# Patient Record
Sex: Female | Born: 1935 | Race: White | Hispanic: No | Marital: Single | Smoking: Never smoker
Health system: Southern US, Community
[De-identification: ages and names within clinical notes are randomized; demographics above are authoritative.]

## PROBLEM LIST (undated history)

## (undated) DIAGNOSIS — I10 Essential (primary) hypertension: Secondary | ICD-10-CM

## (undated) DIAGNOSIS — F0390 Unspecified dementia without behavioral disturbance: Secondary | ICD-10-CM

## (undated) DIAGNOSIS — M1990 Unspecified osteoarthritis, unspecified site: Secondary | ICD-10-CM

## (undated) DIAGNOSIS — E079 Disorder of thyroid, unspecified: Secondary | ICD-10-CM

## (undated) DIAGNOSIS — M419 Scoliosis, unspecified: Secondary | ICD-10-CM

## (undated) HISTORY — PX: ABDOMINAL HYSTERECTOMY: SHX81

## (undated) HISTORY — PX: TONSILLECTOMY: SUR1361

---

## 2000-02-13 ENCOUNTER — Emergency Department (HOSPITAL_COMMUNITY): Admission: EM | Admit: 2000-02-13 | Discharge: 2000-02-13 | Payer: Self-pay | Admitting: Emergency Medicine

## 2000-02-13 ENCOUNTER — Encounter: Payer: Self-pay | Admitting: Emergency Medicine

## 2012-12-29 ENCOUNTER — Emergency Department (HOSPITAL_BASED_OUTPATIENT_CLINIC_OR_DEPARTMENT_OTHER)
Admission: EM | Admit: 2012-12-29 | Discharge: 2012-12-29 | Disposition: A | Discharge status: Home / Self Care | Payer: Medicare Other | Attending: Emergency Medicine | Admitting: Emergency Medicine

## 2012-12-29 ENCOUNTER — Encounter (HOSPITAL_BASED_OUTPATIENT_CLINIC_OR_DEPARTMENT_OTHER): Payer: Self-pay | Admitting: *Deleted

## 2012-12-29 ENCOUNTER — Emergency Department (HOSPITAL_BASED_OUTPATIENT_CLINIC_OR_DEPARTMENT_OTHER): Payer: Medicare Other

## 2012-12-29 DIAGNOSIS — S2239XA Fracture of one rib, unspecified side, initial encounter for closed fracture: Secondary | ICD-10-CM | POA: Insufficient documentation

## 2012-12-29 DIAGNOSIS — E079 Disorder of thyroid, unspecified: Secondary | ICD-10-CM | POA: Insufficient documentation

## 2012-12-29 DIAGNOSIS — Y929 Unspecified place or not applicable: Secondary | ICD-10-CM | POA: Insufficient documentation

## 2012-12-29 DIAGNOSIS — Z79899 Other long term (current) drug therapy: Secondary | ICD-10-CM | POA: Insufficient documentation

## 2012-12-29 DIAGNOSIS — W07XXXA Fall from chair, initial encounter: Secondary | ICD-10-CM | POA: Insufficient documentation

## 2012-12-29 DIAGNOSIS — Y9389 Activity, other specified: Secondary | ICD-10-CM | POA: Insufficient documentation

## 2012-12-29 DIAGNOSIS — I10 Essential (primary) hypertension: Secondary | ICD-10-CM | POA: Insufficient documentation

## 2012-12-29 HISTORY — DX: Essential (primary) hypertension: I10

## 2012-12-29 HISTORY — DX: Disorder of thyroid, unspecified: E07.9

## 2012-12-29 MED ORDER — IBUPROFEN 400 MG PO TABS
400.0000 mg | ORAL_TABLET | Freq: Once | ORAL | Status: AC
  Administered 2012-12-29: 400 mg via ORAL
  Filled 2012-12-29: qty 1

## 2012-12-29 MED ORDER — OXYCODONE-ACETAMINOPHEN 5-325 MG PO TABS
1.0000 | ORAL_TABLET | Freq: Once | ORAL | Status: AC
  Administered 2012-12-29: 1 via ORAL
  Filled 2012-12-29 (×2): qty 1

## 2012-12-29 MED ORDER — OXYCODONE-ACETAMINOPHEN 5-325 MG PO TABS: 1.0000 | ORAL_TABLET | Freq: Three times a day (TID) | ORAL | Status: AC | PRN

## 2012-12-29 NOTE — ED Notes (Signed)
Sitting in a chair and fell hitting the arm of a chair.  C.o pain in her right ribs.

## 2012-12-29 NOTE — ED Notes (Signed)
MD at bedside. 

## 2012-12-29 NOTE — ED Notes (Signed)
Patient resident at [facility]. Pt accompanied by Daughter, in waiting room.

## 2012-12-29 NOTE — ED Notes (Signed)
Patient back from  X-ray 

## 2012-12-29 NOTE — ED Provider Notes (Signed)
History     CSN: [id number]  Arrival date & time 12/29/12  1925   First MD Initiated Contact with Patient 12/29/12 1931      Chief Complaint  Patient presents with  . Fall    (Consider location/radiation/quality/duration/timing/severity/associated sxs/prior treatment) HPI Comments: She denies any weakness or numbness.  No focal deficits causing the fall.  No syncope or LOC.  Patient is a 79 y.o. female presenting with fall. The history is provided by the patient.  Fall The accident occurred 6 to 12 hours ago. Fall occurred: she was sitting down in a chair and lost her balance sitting down abruptly and thinks she must have hit her ribs on arm of the chair. She fell from a height of 1 to 2 ft. Impact surface: firm surface. There was no blood loss. Point of impact: right ribs. Pain location: right ribs. The pain is at a severity of 7/10. The pain is moderate. She was ambulatory at the scene. Pertinent negatives include no numbness, no abdominal pain, no nausea, no vomiting, no headaches and no loss of consciousness. The symptoms are aggravated by activity and pressure on the injury. She has tried nothing for the symptoms. The treatment provided no relief.    Past Medical History  Diagnosis Date  . Hypertension   . Thyroid disease     Past Surgical History  Procedure Laterality Date  . Tonsillectomy      No family history on file.  History  Substance Use Topics  . Smoking status: Never Smoker   . Smokeless tobacco: Not on file  . Alcohol Use: No    OB History   Grav Para Term Preterm Abortions TAB SAB Ect Mult Living                  Review of Systems  Respiratory: Negative for shortness of breath.   Cardiovascular: Positive for chest pain. Negative for leg swelling.  Gastrointestinal: Negative for nausea, vomiting and abdominal pain.  Neurological: Negative for dizziness, seizures, loss of consciousness, syncope, speech difficulty, weakness, light-headedness, numbness  and headaches.  All other systems reviewed and are negative.    Allergies  Review of patient's allergies indicates no known allergies.  Home Medications   Current Outpatient Rx  Name  Route  Sig  Dispense  Refill  . Alendronate Sodium (FOSAMAX PO)   Oral   Take by mouth.         . Donepezil HCl (ARICEPT PO)   Oral   Take by mouth.         . ENALAPRIL MALEATE PO   Oral   Take by mouth.         . Levothyroxine Sodium (SYNTHROID PO)   Oral   Take by mouth.         Marland Kitchen LOVASTATIN PO   Oral   Take by mouth.         . Memantine HCl (NAMENDA PO)   Oral   Take by mouth.           BP 165/63  Pulse 62  Temp(Src) 98.6 F (37 C) (Oral)  Resp 18  SpO2 95%  Physical Exam  Nursing note and vitals reviewed. Constitutional: She is oriented to person, place, and time. She appears well-developed and well-nourished. She appears distressed.  HENT:  Head: Normocephalic and atraumatic.  Eyes: EOM are normal. Pupils are equal, round, and reactive to light.  Cardiovascular: Normal rate, regular rhythm, normal heart sounds and intact distal pulses.  Exam reveals no friction rub.   No murmur heard. Pulmonary/Chest: Effort normal and breath sounds normal. She has no wheezes. She has no rales. She exhibits tenderness and bony tenderness. She exhibits no laceration, no crepitus, no deformity and no retraction.    Abdominal: Soft. Normal appearance and bowel sounds are normal. She exhibits no distension. There is no tenderness. There is no rebound and no guarding.  No RUQ tenderness  Musculoskeletal: Normal range of motion. She exhibits no tenderness.  No edema  Neurological: She is alert and oriented to person, place, and time. No cranial nerve deficit.  Skin: Skin is warm and dry. No rash noted.  Psychiatric: She has a normal mood and affect. Her behavior is normal.    ED Course  Procedures (including critical care time)  Labs Reviewed - No data to display Dg Ribs  Unilateral W/chest Right  12/29/2012  *RADIOLOGY REPORT*  Clinical Data: Fall  RIGHT RIBS AND CHEST - 3+ VIEW  Comparison: None.  Findings: Normal heart size.  Lungs are hyperaerated with interstitial prominence.  Mild apical pleural thickening has a chronic appearance.  No pneumothorax.  No pleural effusion. Pulmonary vascularity is within normal limits.  Levoscoliosis with the apex at L2-3 is present.  Degenerative changes in the lumbar spine are associated.  There is a minimally displaced fracture of the anterolateral right eighth rib.  Remainder of the bony framework is grossly intact.  Proximal right humerus deformity is noted and acute proximal right humerus fracture is suspected.  IMPRESSION: Minimally displaced acute right eighth rib fracture.  Proximal right humerus fracture suspected.  Right shoulder study is recommended.  Changes related to COPD.  No pneumothorax.   Original Report Authenticated By: Jolaine Click, M.D.    Dg Humerus Right  12/29/2012  *RADIOLOGY REPORT*  Clinical Data: 79 year old female status post fall.  Right humerus pain.  History of right humerus fracture.  RIGHT HUMERUS - 2+ VIEW  Comparison: [facility] chest radiograph 01/01/2011.  Findings: The proximal right humerus has been fractured, but there is callus formation suggesting this is a late subacute or chronic injury.  There is subsequent impaction.  These changes are new since 2012.  Grossly normal alignment elsewhere the right shoulder. More distal right humerus appears intact with grossly normal alignment at the right elbow.  IMPRESSION: Late subacute to chronic-appearing proximal right humerus fracture which is new since 2012.  No definite acute right humerus fracture.   Original Report Authenticated By: Erskine Speed, M.D.      1. Rib fracture       MDM   Patient states she was sitting down in a chair today and lost her balance and sat down abruptly. She thinks she must have hit her right ribs on the arm  of the chair. Since about 2:00 she's had severe pain in her right ribs that is not improving. She denies shortness of breath and denies any abdominal pain. No LOC and denies any focal weakness. She has point tenderness to her right rib cage as well as mild ecchymosis. She has taken nothing for the pain and was given ibuprofen. Rib films and chest x-ray pending   11:35 PM Chest x-ray shows a mildly displaced acute right eighth rib fracture. Also concern for possible humeral fracture on chest x-ray. On exam patient does have some tenderness but states that she had a prior injury care over a year ago. Dedicated humeral films show a chronic appearing right humeral fracture and given patient's story  and the way she injured her ribs do not feel this is a new injury. She was given pain control and close followup instructions     Gwyneth Sprout, MD 12/29/12 2336

## 2015-02-03 ENCOUNTER — Emergency Department (HOSPITAL_BASED_OUTPATIENT_CLINIC_OR_DEPARTMENT_OTHER)
Admission: EM | Admit: 2015-02-03 | Discharge: 2015-02-04 | Disposition: A | Discharge status: Other Acute Inpatient Hospital | Payer: Medicare Other | Attending: Emergency Medicine | Admitting: Emergency Medicine

## 2015-02-03 ENCOUNTER — Emergency Department (HOSPITAL_BASED_OUTPATIENT_CLINIC_OR_DEPARTMENT_OTHER): Payer: Medicare Other

## 2015-02-03 ENCOUNTER — Encounter (HOSPITAL_BASED_OUTPATIENT_CLINIC_OR_DEPARTMENT_OTHER): Payer: Self-pay | Admitting: *Deleted

## 2015-02-03 DIAGNOSIS — M1990 Unspecified osteoarthritis, unspecified site: Secondary | ICD-10-CM | POA: Diagnosis not present

## 2015-02-03 DIAGNOSIS — F0390 Unspecified dementia without behavioral disturbance: Secondary | ICD-10-CM | POA: Insufficient documentation

## 2015-02-03 DIAGNOSIS — R4182 Altered mental status, unspecified: Secondary | ICD-10-CM | POA: Diagnosis present

## 2015-02-03 DIAGNOSIS — N390 Urinary tract infection, site not specified: Secondary | ICD-10-CM | POA: Diagnosis not present

## 2015-02-03 DIAGNOSIS — E079 Disorder of thyroid, unspecified: Secondary | ICD-10-CM | POA: Diagnosis not present

## 2015-02-03 DIAGNOSIS — Z791 Long term (current) use of non-steroidal anti-inflammatories (NSAID): Secondary | ICD-10-CM | POA: Diagnosis not present

## 2015-02-03 DIAGNOSIS — I10 Essential (primary) hypertension: Secondary | ICD-10-CM | POA: Insufficient documentation

## 2015-02-03 DIAGNOSIS — R000 Tachycardia, unspecified: Secondary | ICD-10-CM | POA: Insufficient documentation

## 2015-02-03 DIAGNOSIS — Z79899 Other long term (current) drug therapy: Secondary | ICD-10-CM | POA: Diagnosis not present

## 2015-02-03 DIAGNOSIS — Z90710 Acquired absence of both cervix and uterus: Secondary | ICD-10-CM | POA: Diagnosis not present

## 2015-02-03 HISTORY — DX: Unspecified osteoarthritis, unspecified site: M19.90

## 2015-02-03 HISTORY — DX: Scoliosis, unspecified: M41.9

## 2015-02-03 HISTORY — DX: Unspecified dementia, unspecified severity, without behavioral disturbance, psychotic disturbance, mood disturbance, and anxiety: F03.90

## 2015-02-03 LAB — URINALYSIS, ROUTINE W REFLEX MICROSCOPIC
BILIRUBIN URINE: NEGATIVE
Glucose, UA: NEGATIVE mg/dL
Ketones, ur: 15 mg/dL — AB
Nitrite: NEGATIVE
PROTEIN: 100 mg/dL — AB
SPECIFIC GRAVITY, URINE: 1.012 (ref 1.005–1.030)
UROBILINOGEN UA: 1 mg/dL (ref 0.0–1.0)
pH: 7.5 (ref 5.0–8.0)

## 2015-02-03 LAB — URINE MICROSCOPIC-ADD ON

## 2015-02-03 LAB — COMPREHENSIVE METABOLIC PANEL
ALT: 27 U/L (ref 0–35)
ANION GAP: 10 (ref 5–15)
AST: 31 U/L (ref 0–37)
Albumin: 3.6 g/dL (ref 3.5–5.2)
Alkaline Phosphatase: 58 U/L (ref 39–117)
BILIRUBIN TOTAL: 0.8 mg/dL (ref 0.3–1.2)
BUN: 19 mg/dL (ref 6–23)
CHLORIDE: 104 mmol/L (ref 96–112)
CO2: 23 mmol/L (ref 19–32)
Calcium: 8.5 mg/dL (ref 8.4–10.5)
Creatinine, Ser: 0.91 mg/dL (ref 0.50–1.10)
GFR, EST AFRICAN AMERICAN: 62 mL/min — AB (ref >90)
GFR, EST NON AFRICAN AMERICAN: 53 mL/min — AB (ref >90)
GLUCOSE: 125 mg/dL — AB (ref 70–99)
POTASSIUM: 3.2 mmol/L — AB (ref 3.5–5.1)
SODIUM: 137 mmol/L (ref 135–145)
Total Protein: 6.7 g/dL (ref 6.0–8.3)

## 2015-02-03 LAB — CBC WITH DIFFERENTIAL/PLATELET
Basophils Absolute: 0 10*3/uL (ref 0.0–0.1)
Basophils Relative: 0 % (ref 0–1)
Eosinophils Absolute: 0 10*3/uL (ref 0.0–0.7)
Eosinophils Relative: 0 % (ref 0–5)
HEMATOCRIT: 41 % (ref 36.0–46.0)
HEMOGLOBIN: 13.4 g/dL (ref 12.0–15.0)
LYMPHS ABS: 0.4 10*3/uL — AB (ref 0.7–4.0)
Lymphocytes Relative: 3 % — ABNORMAL LOW (ref 12–46)
MCH: 32.7 pg (ref 26.0–34.0)
MCHC: 32.7 g/dL (ref 30.0–36.0)
MCV: 100 fL (ref 78.0–100.0)
MONOS PCT: 5 % (ref 3–12)
Monocytes Absolute: 0.9 10*3/uL (ref 0.1–1.0)
NEUTROS ABS: 14.8 10*3/uL — AB (ref 1.7–7.7)
NEUTROS PCT: 92 % — AB (ref 43–77)
PLATELETS: 182 10*3/uL (ref 150–400)
RBC: 4.1 MIL/uL (ref 3.87–5.11)
RDW: 14.2 % (ref 11.5–15.5)
WBC: 16.1 10*3/uL — ABNORMAL HIGH (ref 4.0–10.5)

## 2015-02-03 LAB — I-STAT CG4 LACTIC ACID, ED: Lactic Acid, Venous: 0.9 mmol/L (ref 0.5–2.0)

## 2015-02-03 MED ORDER — DEXTROSE 5 % IV SOLN: 2.0000 g | Freq: Once | INTRAVENOUS | Status: DC

## 2015-02-03 MED ORDER — DEXTROSE 5 % IV SOLN
2.0000 g | INTRAVENOUS | Status: DC
  Administered 2015-02-04: 2 g via INTRAVENOUS

## 2015-02-03 MED ORDER — ACETAMINOPHEN 325 MG PO TABS
650.0000 mg | ORAL_TABLET | Freq: Once | ORAL | Status: AC
  Administered 2015-02-03: 650 mg via ORAL
  Filled 2015-02-03: qty 2

## 2015-02-03 MED ORDER — SODIUM CHLORIDE 0.9 % IV BOLUS (SEPSIS)
1000.0000 mL | Freq: Once | INTRAVENOUS | Status: AC
  Administered 2015-02-03: 1000 mL via INTRAVENOUS

## 2015-02-03 MED ORDER — SODIUM CHLORIDE 0.9 % IV SOLN
1000.0000 mL | INTRAVENOUS | Status: DC
  Administered 2015-02-04: 1000 mL via INTRAVENOUS

## 2015-02-03 NOTE — ED Notes (Signed)
Daughter reports she last saw pt on Wednesday when she took the pt to her doctor and the pt received shots in her knees. She sts that today she arrived at the pt's home to find her weak, not answering questions and with tremors.

## 2015-02-03 NOTE — ED Provider Notes (Signed)
CSN: [id number]     Arrival date & time 02/03/15  1748 History  This chart was scribed for Alexis Loveless, MD by Evon Slack, ED Scribe. This patient was seen in room MH09/MH09 and the patient's care was started at 6:22 PM.      Chief Complaint  Patient presents with  . Altered Mental Status   The history is provided by a relative. No language interpreter was used.   HPI Comments: Alexis Green is a 81 y.o. female who presents to the Emergency Department complaining of AMS onset today. Daughter states that the last time she saw her was 2 days ago. Daughter states she presents with weakness, tremors, and confusion. Daughter characterizes her confusion as "she knows what she want to say but cant get anything out." Mother states that she has also urinated on her self while laying on the floor. Daughter states that 2 days ago she received injections in her bilateral knees for pain control. Daughter states that she also received epidural injection 9 days ago for her Hx of back pain. Daughter denies vomiting, cough or abdominal pain.   Past Medical History  Diagnosis Date  . Hypertension   . Thyroid disease   . Dementia   . Arthritis   . Scoliosis    Past Surgical History  Procedure Laterality Date  . Tonsillectomy    . Abdominal hysterectomy     No family history on file. History  Substance Use Topics  . Smoking status: Never Smoker   . Smokeless tobacco: Not on file  . Alcohol Use: No   OB History    No data available      Review of Systems  Constitutional: Positive for fever.  Respiratory: Negative for cough.   Gastrointestinal: Negative for abdominal pain.  Neurological: Positive for tremors and weakness.  All other systems reviewed and are negative.   Allergies  Sulfa antibiotics  Home Medications   Prior to Admission medications   Medication Sig Start Date End Date Taking? Authorizing Provider  amLODipine (NORVASC) 5 MG tablet Take 5 mg by mouth daily.   Yes  Historical Provider, MD  Donepezil HCl (ARICEPT PO) Take by mouth.   Yes Historical Provider, MD  ENALAPRIL MALEATE PO Take by mouth.   Yes Historical Provider, MD  Levothyroxine Sodium (SYNTHROID PO) Take by mouth.   Yes Historical Provider, MD  meloxicam (MOBIC) 7.5 MG tablet Take 5 mg by mouth daily.   Yes Historical Provider, MD  Memantine HCl (NAMENDA PO) Take by mouth.   Yes Historical Provider, MD  Alendronate Sodium (FOSAMAX PO) Take by mouth.    Historical Provider, MD  LOVASTATIN PO Take by mouth.    Historical Provider, MD  oxyCODONE-acetaminophen (PERCOCET/ROXICET) 5-325 MG per tablet Take 1 tablet by mouth every 8 (eight) hours as needed for pain (or take before bed). 12/29/12   Gwyneth Sprout, MD   BP 171/79 mmHg  Pulse 106  Temp(Src) 101.3 F (38.5 C)  Resp 18  Wt 122 lb (55.339 kg)  SpO2 99%   Physical Exam  Constitutional: She appears well-developed and well-nourished. No distress.  HENT:  Head: Normocephalic and atraumatic.  Eyes: Conjunctivae and EOM are normal.  Neck: Neck supple. No tracheal deviation present.  Cardiovascular: Regular rhythm and normal heart sounds.  Tachycardia present.   Pulmonary/Chest: Effort normal and breath sounds normal. No respiratory distress. She has no wheezes. She has no rales.  Abdominal: Soft. She exhibits no distension. There is no tenderness.  Musculoskeletal:  Normal range of motion.  No back tenderness or cellulitis. Bilateral knees without swelling or erythema.   Neurological: She is alert.  Alert but disoriented.    Skin: Skin is warm and dry. No erythema.  Nursing note and vitals reviewed.   ED Course  Procedures (including critical care time) DIAGNOSTIC STUDIES: Oxygen Saturation is 99% on RA, normal by my interpretation.    COORDINATION OF CARE: 6:49 PM-Discussed treatment plan with family at bedside and family agreed to plan.      Labs Review Labs Reviewed  CBC WITH DIFFERENTIAL/PLATELET - Abnormal; Notable  for the following:    WBC 16.1 (*)    Neutrophils Relative % 92 (*)    Neutro Abs 14.8 (*)    Lymphocytes Relative 3 (*)    Lymphs Abs 0.4 (*)    All other components within normal limits  COMPREHENSIVE METABOLIC PANEL - Abnormal; Notable for the following:    Potassium 3.2 (*)    Glucose, Bld 125 (*)    GFR calc non Af Amer 53 (*)    GFR calc Af Amer 62 (*)    All other components within normal limits  URINALYSIS, ROUTINE W REFLEX MICROSCOPIC - Abnormal; Notable for the following:    APPearance CLOUDY (*)    Hgb urine dipstick MODERATE (*)    Ketones, ur 15 (*)    Protein, ur 100 (*)    Leukocytes, UA MODERATE (*)    All other components within normal limits  URINE MICROSCOPIC-ADD ON - Abnormal; Notable for the following:    Squamous Epithelial / LPF FEW (*)    Bacteria, UA MANY (*)    All other components within normal limits  CULTURE, BLOOD (ROUTINE X 2)  CULTURE, BLOOD (ROUTINE X 2)  URINE CULTURE  I-STAT CG4 LACTIC ACID, ED  I-STAT CG4 LACTIC ACID, ED    Imaging Review Ct Head Wo Contrast  02/03/2015   CLINICAL DATA:  Altered mental status  EXAM: CT HEAD WITHOUT CONTRAST  TECHNIQUE: Contiguous axial images were obtained from the base of the skull through the vertex without intravenous contrast.  COMPARISON:  02/25/2012  FINDINGS: No skull fracture is noted. Paranasal sinuses and mastoid air cells are unremarkable. Atherosclerotic calcifications of carotid siphon are noted. Moderate cerebral atrophy. Extensive periventricular and patchy subcortical white matter decreased attenuation consistent with chronic small vessel ischemic changes. No definite acute cortical infarction. No mass lesion is noted on this unenhanced scan.  IMPRESSION: No acute intracranial abnormality. Stable atrophy and extensive chronic white matter disease.   Electronically Signed   By: Natasha MeadLiviu  Pop M.D.   On: 02/03/2015 19:38   Dg Chest Port 1 View  02/03/2015   CLINICAL DATA:  Altered mental status  EXAM:  PORTABLE CHEST - 1 VIEW  COMPARISON:  December 29, 2012  FINDINGS: There is no edema or consolidation. Heart size and pulmonary vascularity are normal. No adenopathy. There is thoracolumbar levoscoliosis.  IMPRESSION: No edema or consolidation.   Electronically Signed   By: Bretta BangWilliam  Woodruff III M.D.   On: 02/03/2015 19:15     EKG Interpretation   Date/Time:  Friday February 03 2015 19:41:19 EDT Ventricular Rate:  87 PR Interval:    QRS Duration: 80 QT Interval:  356 QTC Calculation: 428 R Axis:   47 Text Interpretation:  Atrial fibrillation with a competing junctional  pacemaker with premature ventricular or aberrantly conducted complexes  Septal infarct , age undetermined ST \\T \ T wave abnormality, consider  inferolateral ischemia Abnormal ECG  artifact limits interpretation No old  tracing to compare Confirmed by Evyn Kooyman  MD, Ricquel Foulk [phone]) on 02/03/2015  7:56:57 PM      MDM   Final diagnoses:  UTI (lower urinary tract infection)   Patient's fever appears to be coming from a UTI. Given Tylenol, fluids, and blood cultures obtained. Given IV Rocephin after urinalysis evaluated. This shouldn't explain her altered mental status as well. She is awake and alert and does not appear ill, has no neck stiffness, and I have very loose patient for a CNS infection. Given her elevated white blood cell count, fever, and age with UTI with sepsis, she will need admission with IV antibiotics and fluids. Discussed with the hospitalist at [facility], Dr. Lowell Guitar, who accepts admission and transfer.  I personally performed the services described in this documentation, which was scribed in my presence. The recorded information has been reviewed and is accurate.      Alexis Loveless, MD 02/03/15 2325

## 2015-02-03 NOTE — ED Notes (Signed)
Steward DroneBrenda Suman  [phone]

## 2015-02-04 DIAGNOSIS — I10 Essential (primary) hypertension: Secondary | ICD-10-CM | POA: Diagnosis not present

## 2015-02-04 DIAGNOSIS — R4182 Altered mental status, unspecified: Secondary | ICD-10-CM | POA: Diagnosis present

## 2015-02-04 DIAGNOSIS — M1990 Unspecified osteoarthritis, unspecified site: Secondary | ICD-10-CM | POA: Diagnosis not present

## 2015-02-04 DIAGNOSIS — F0390 Unspecified dementia without behavioral disturbance: Secondary | ICD-10-CM | POA: Diagnosis not present

## 2015-02-04 DIAGNOSIS — N390 Urinary tract infection, site not specified: Secondary | ICD-10-CM | POA: Diagnosis not present

## 2015-02-04 DIAGNOSIS — Z79899 Other long term (current) drug therapy: Secondary | ICD-10-CM | POA: Diagnosis not present

## 2015-02-04 DIAGNOSIS — E079 Disorder of thyroid, unspecified: Secondary | ICD-10-CM | POA: Diagnosis not present

## 2015-02-04 DIAGNOSIS — R000 Tachycardia, unspecified: Secondary | ICD-10-CM | POA: Diagnosis not present

## 2015-02-04 DIAGNOSIS — Z791 Long term (current) use of non-steroidal anti-inflammatories (NSAID): Secondary | ICD-10-CM | POA: Diagnosis not present

## 2015-02-04 DIAGNOSIS — Z90710 Acquired absence of both cervix and uterus: Secondary | ICD-10-CM | POA: Diagnosis not present

## 2015-02-04 MED ORDER — CEFTRIAXONE SODIUM 2 G IJ SOLR
INTRAMUSCULAR | Status: AC
  Filled 2015-02-04: qty 2

## 2015-02-04 NOTE — ED Notes (Signed)
[facility] at bedside 

## 2015-02-06 ENCOUNTER — Telehealth (HOSPITAL_COMMUNITY): Payer: Self-pay

## 2015-02-06 LAB — CULTURE, BLOOD (ROUTINE X 2)

## 2015-02-06 LAB — URINE CULTURE

## 2016-02-13 IMAGING — CT CT HEAD W/O CM
1 series · 16 of 30 positions shown, 20 images · non-contrast
Comparison: 02/25/2012

CLINICAL DATA: Altered mental status

EXAM:
CT HEAD WITHOUT CONTRAST
TECHNIQUE: Contiguous axial images were obtained from the base of the skull
through the vertex without intravenous contrast.

[Series 2: head 4.8 h37s · axial · 0.47mm/px · z∈[-151,-18]mm · 16 of 32 slices shown, 20 images]
[im 2/32  brain]
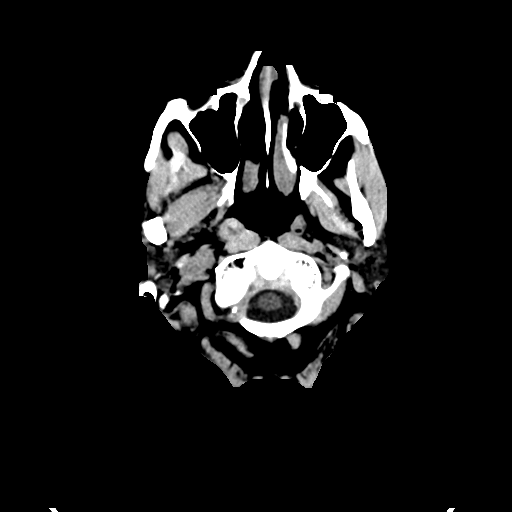
[im 2/32  bone]
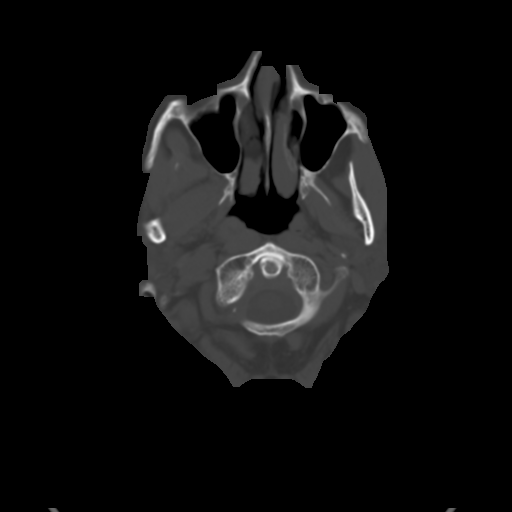
[im 4/32  brain]
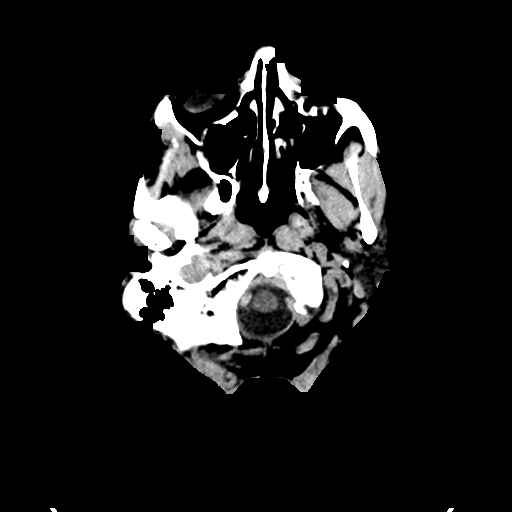
[im 6/32  brain]
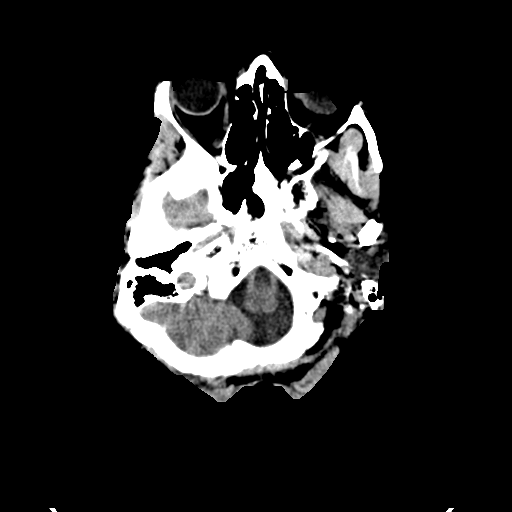
[im 8/32  brain]
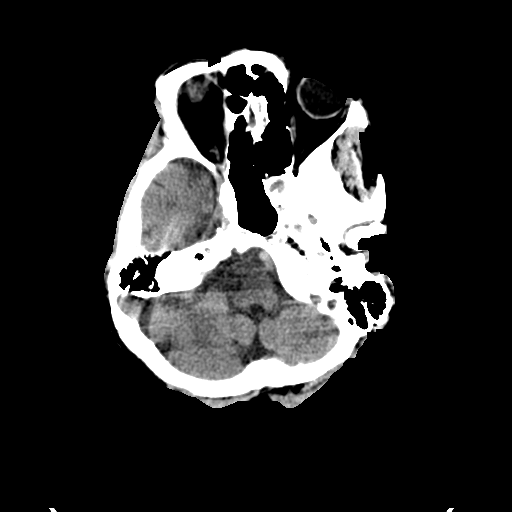
[im 9/32  brain]
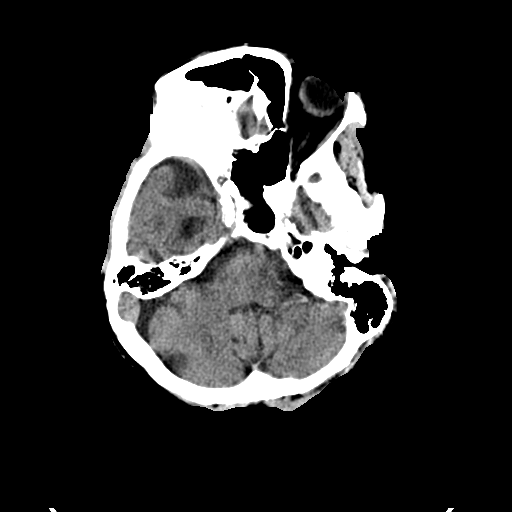
[im 9/32  bone]
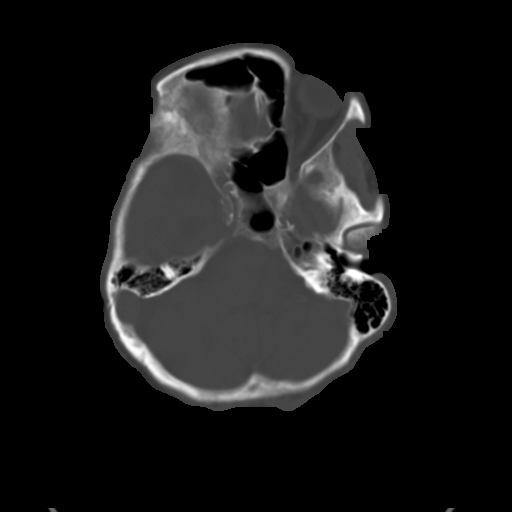
[im 11/32  brain]
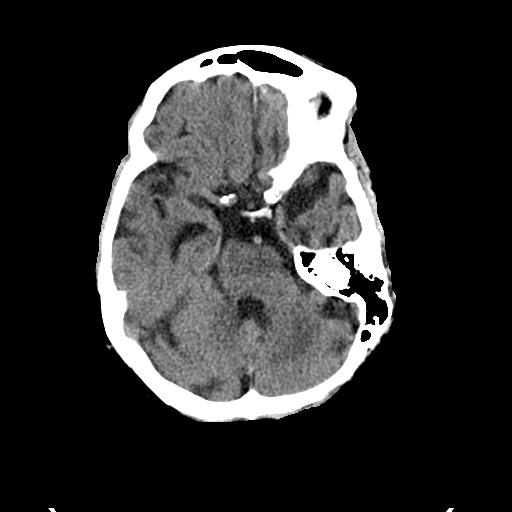
[im 13/32  brain]
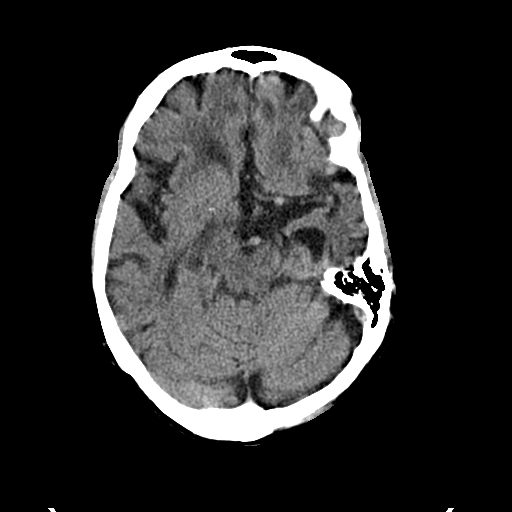
[im 15/32  brain]
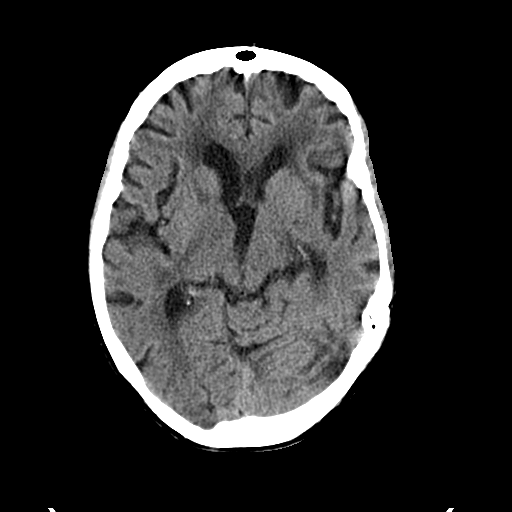
[im 17/32  brain]
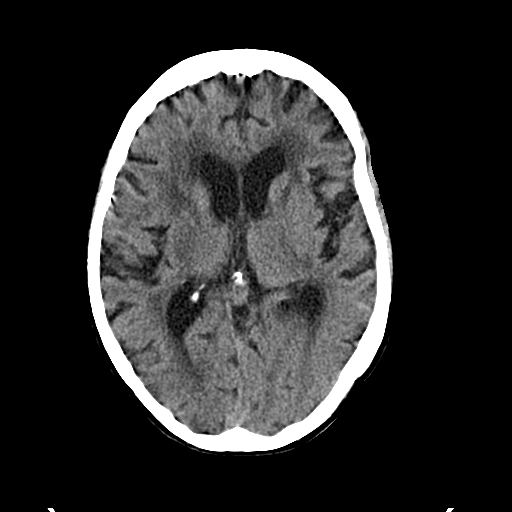
[im 17/32  bone]
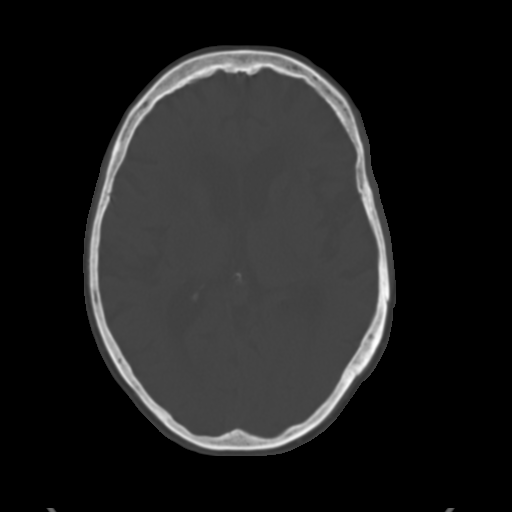
[im 19/32  brain]
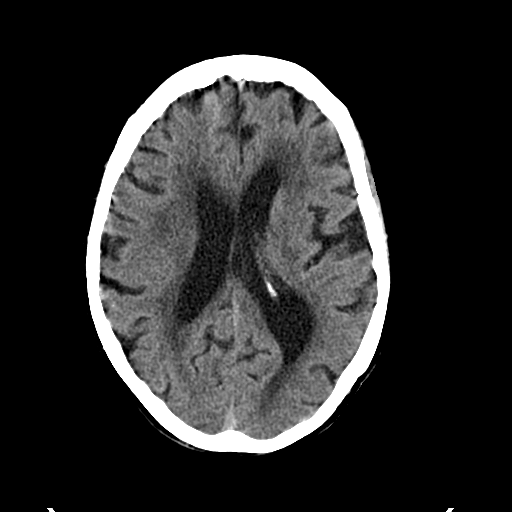
[im 21/32  brain]
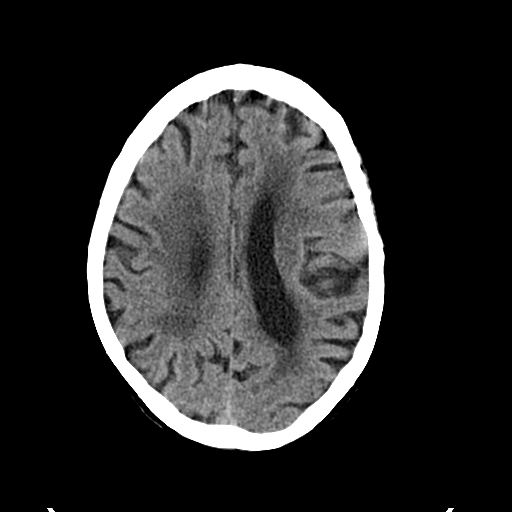
[im 23/32  brain]
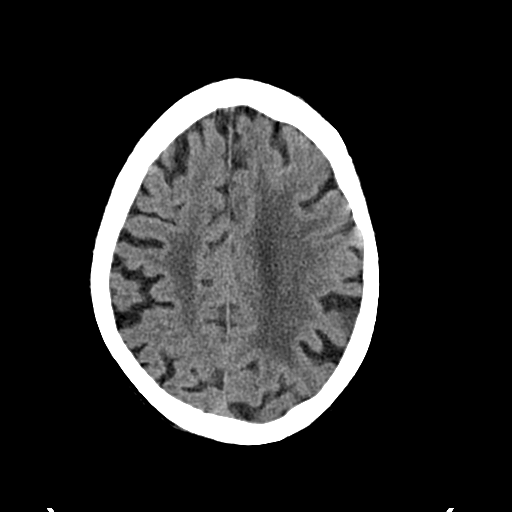
[im 24/32  brain]
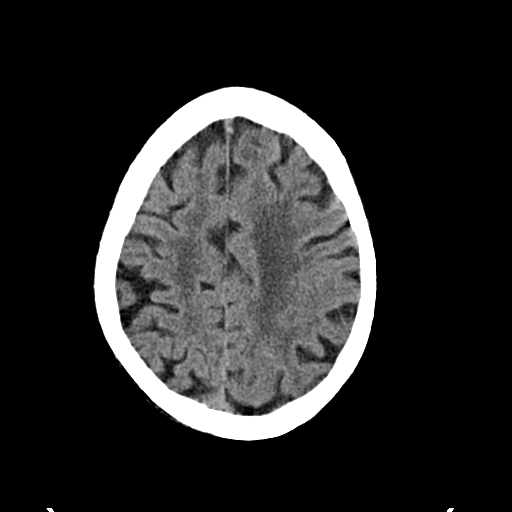
[im 24/32  bone]
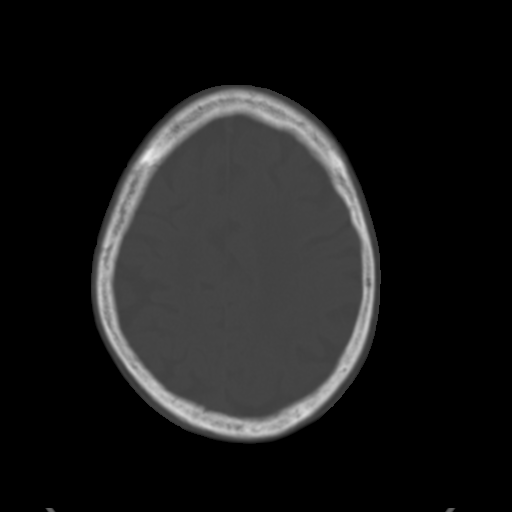
[im 26/32  brain]
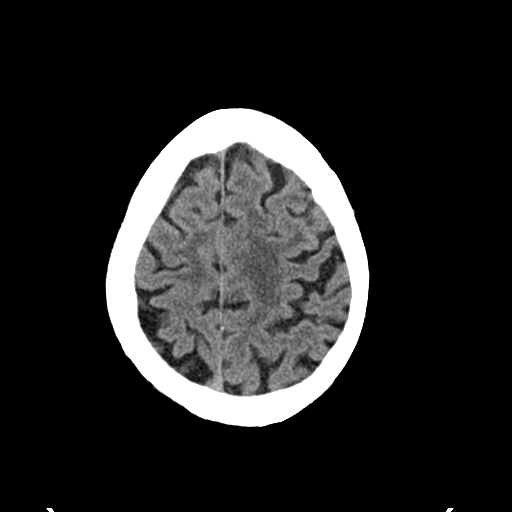
[im 28/32  brain]
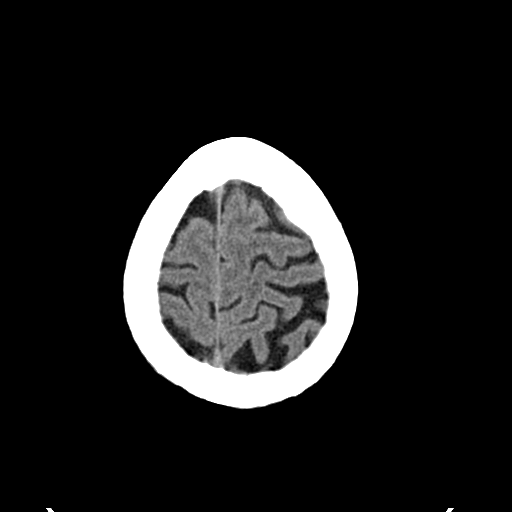
[im 30/32  brain]
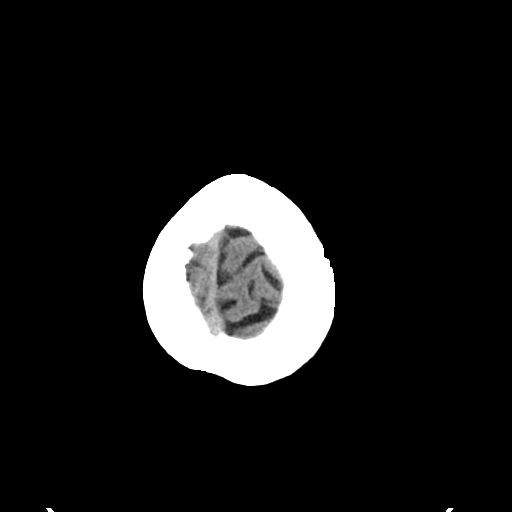

[16 of 30 positions shown; findings below may reference images not displayed]

FINDINGS: No skull fracture is noted. Paranasal sinuses and mastoid air cells
are unremarkable. Atherosclerotic calcifications of carotid siphon
are noted. Moderate cerebral atrophy. Extensive periventricular and
patchy subcortical white matter decreased attenuation consistent
with chronic small vessel ischemic changes. No definite acute
cortical infarction. No mass lesion is noted on this unenhanced
scan.
IMPRESSION: No acute intracranial abnormality. Stable atrophy and extensive
chronic white matter disease.

## 2017-07-12 DEATH — deceased
